# Patient Record
Sex: Male | Born: 1965 | Hispanic: Yes | Marital: Married | Smoking: Never smoker
Health system: Southern US, Community
[De-identification: ages and names within clinical notes are randomized; demographics above are authoritative.]

## PROBLEM LIST (undated history)

## (undated) DIAGNOSIS — E119 Type 2 diabetes mellitus without complications: Secondary | ICD-10-CM

## (undated) DIAGNOSIS — I10 Essential (primary) hypertension: Secondary | ICD-10-CM

---

## 2019-08-13 ENCOUNTER — Other Ambulatory Visit: Payer: Self-pay

## 2019-08-13 ENCOUNTER — Emergency Department (HOSPITAL_BASED_OUTPATIENT_CLINIC_OR_DEPARTMENT_OTHER): Payer: Worker's Compensation

## 2019-08-13 ENCOUNTER — Emergency Department (HOSPITAL_BASED_OUTPATIENT_CLINIC_OR_DEPARTMENT_OTHER)
Admission: EM | Admit: 2019-08-13 | Discharge: 2019-08-13 | Disposition: A | Discharge status: Home / Self Care | Payer: Worker's Compensation | Attending: Emergency Medicine | Admitting: Emergency Medicine

## 2019-08-13 ENCOUNTER — Encounter (HOSPITAL_BASED_OUTPATIENT_CLINIC_OR_DEPARTMENT_OTHER): Payer: Self-pay | Admitting: Emergency Medicine

## 2019-08-13 DIAGNOSIS — E119 Type 2 diabetes mellitus without complications: Secondary | ICD-10-CM | POA: Diagnosis not present

## 2019-08-13 DIAGNOSIS — M20011 Mallet finger of right finger(s): Secondary | ICD-10-CM | POA: Diagnosis not present

## 2019-08-13 DIAGNOSIS — I10 Essential (primary) hypertension: Secondary | ICD-10-CM | POA: Insufficient documentation

## 2019-08-13 DIAGNOSIS — M79644 Pain in right finger(s): Secondary | ICD-10-CM | POA: Diagnosis present

## 2019-08-13 HISTORY — DX: Type 2 diabetes mellitus without complications: E11.9

## 2019-08-13 HISTORY — DX: Essential (primary) hypertension: I10

## 2019-08-13 NOTE — ED Triage Notes (Signed)
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

## 2019-08-13 NOTE — Discharge Instructions (Signed)
Please keep splint on at all times.  Please follow-up with hand surgery in 7-10 days. Please call Monday morning to make this appointment.

## 2019-08-13 NOTE — ED Provider Notes (Signed)
[facility] EMERGENCY DEPARTMENT Provider Note   CSN: [id number] Arrival date & time: 08/13/19  0277     History Chief Complaint  Patient presents with  . Finger Injury    Corey Pollard is a 54 y.o. male.  HPI  Patient is a 54 year old male with a history of DM that is well controlled on HTN.  Patient presents today for injury that occurred just prior to arrival when he was at work and had a impact injury to the tip of his right fourth finger.  He states that he now has his right distal fingertip stuck in position states he cannot move it.  He has no pain, has good sensation in his fingertip and states that he has no numbness, laceration, abrasion or bruising.  He states he can move all other fingers in all the joints.  Patient states he has had no orthopedic surgeries on his hands or fingers in the past.  He denies any other injuries or pain.     Past Medical History:  Diagnosis Date  . Diabetes mellitus without complication (HCC)   . Hypertension     There are no problems to display for this patient.   History reviewed. No pertinent surgical history.     No family history on file.  Social History   Tobacco Use  . Smoking status: Never Smoker  . Smokeless tobacco: Never Used  Substance Use Topics  . Alcohol use: Yes  . Drug use: Not on file    Home Medications Prior to Admission medications   Not on File    Allergies    Patient has no known allergies.  Review of Systems   Review of Systems  Constitutional: Negative for chills and fever.  HENT: Negative for congestion.   Respiratory: Negative for shortness of breath.   Cardiovascular: Negative for chest pain.  Gastrointestinal: Negative for abdominal pain.  Musculoskeletal: Negative for neck pain.       Rt ring finger pain    Physical Exam Updated Vital Signs BP 124/80 (BP Location: Left Arm)   Pulse 62   Temp 99.2 F (37.3 C) (Oral)   Resp 18   Ht 5\' 6"  (1.676 m)   Wt 86.2 kg    SpO2 98%   BMI 30.67 kg/m   Physical Exam Vitals and nursing note reviewed.  Constitutional:      General: He is not in acute distress.    Appearance: Normal appearance. He is not ill-appearing.  HENT:     Head: Normocephalic and atraumatic.  Eyes:     General: No scleral icterus.       Right eye: No discharge.        Left eye: No discharge.     Conjunctiva/sclera: Conjunctivae normal.  Cardiovascular:     Rate and Rhythm: Normal rate.     Pulses: Normal pulses.  Pulmonary:     Effort: Pulmonary effort is normal.     Breath sounds: No stridor.  Musculoskeletal:     Comments: Patient full range of motion of all fingers apart from his right fourth finger which has the DIP stuck in partial flexion with inability to extend the distal phalanx.  Sensation is intact.  Strength in both hands is 5/5.  No bruising or deformity.  No tenderness to palpation of phalanx or DIP joint.  No abrasion or laceration.  Skin:    General: Skin is warm and dry.     Capillary Refill: Capillary refill takes less than  2 seconds.  Neurological:     Mental Status: He is alert and oriented to person, place, and time. Mental status is at baseline.     Sensory: No sensory deficit.  Psychiatric:        Mood and Affect: Mood normal.        Behavior: Behavior normal.     ED Results / Procedures / Treatments   Labs (all labs ordered are listed, but only abnormal results are displayed) Labs Reviewed - No data to display  EKG None  Radiology DG Finger Ring Right  Result Date: 08/13/2019 CLINICAL DATA:  Injury to right ring finger at work. EXAM: RIGHT RING FINGER 2+V COMPARISON:  None FINDINGS: There is flexion at the distal interphalangeal joint on all submitted images. There is no sign of fracture. The interphalangeal joint is located. IMPRESSION: Flexion deformity/mallet finger at the distal interphalangeal joint without signs of fracture, this likely represents injury to the extensor tendon.  Electronically Signed   By: Zetta Bills M.D.   On: 08/13/2019 10:17    Procedures Procedures (including critical care time) SPLINT APPLICATION Date/Time: 16:10 AM Authorized by: Tedd Sias Consent: Verbal consent obtained. Risks and benefits: risks, benefits and alternatives were discussed Consent given by: patient Splint applied by: nurse/orthopedic technician Location details: right 4th finger Splint type: static splint Supplies used: plastic splint/coban Post-procedure: The splinted body part was neurovascularly unchanged following the procedure. Patient tolerance: Patient tolerated the procedure well with no immediate complications.    Medications Ordered in ED Medications - No data to display  ED Course  I have reviewed the triage vital signs and the nursing notes.  Pertinent labs & imaging results that were available during my care of the patient were reviewed by me and considered in my medical decision making (see chart for details).    MDM Rules/Calculators/A&P                      Patient with injury to his distal phalanx that occurred just prior to arrival at ED.   Patient has obvious mallet finger.  X-ray personally viewed by myself there is no evidence of fracture.  Pending radiologist read to confirm this.   Physical exam notable for his right ring finger distal phalanx second flexion.  Will splint in extension and have him follow-up with Dr. Fredna Dow of hand surgery in next 7 to 10 days.   I discussed this case with my attending physician who cosigned this note including patient's presenting symptoms, physical exam, and planned diagnostics and interventions. Attending physician stated agreement with plan or made changes to plan which were implemented.   Final Clinical Impression(s) / ED Diagnoses Final diagnoses:  Mallet finger of right hand    Rx / DC Orders ED Discharge Orders    None       Tedd Sias, [place] 08/13/19 [place],  DO 08/13/19 1030

## 2020-11-16 IMAGING — CR DG FINGER RING 2+V*R*
3 series · 3 of 3 positions shown · non-contrast
Comparison: None

CLINICAL DATA: Injury to right ring finger at work.

EXAM:
RIGHT RING FINGER 2+V

[x finger pa right]
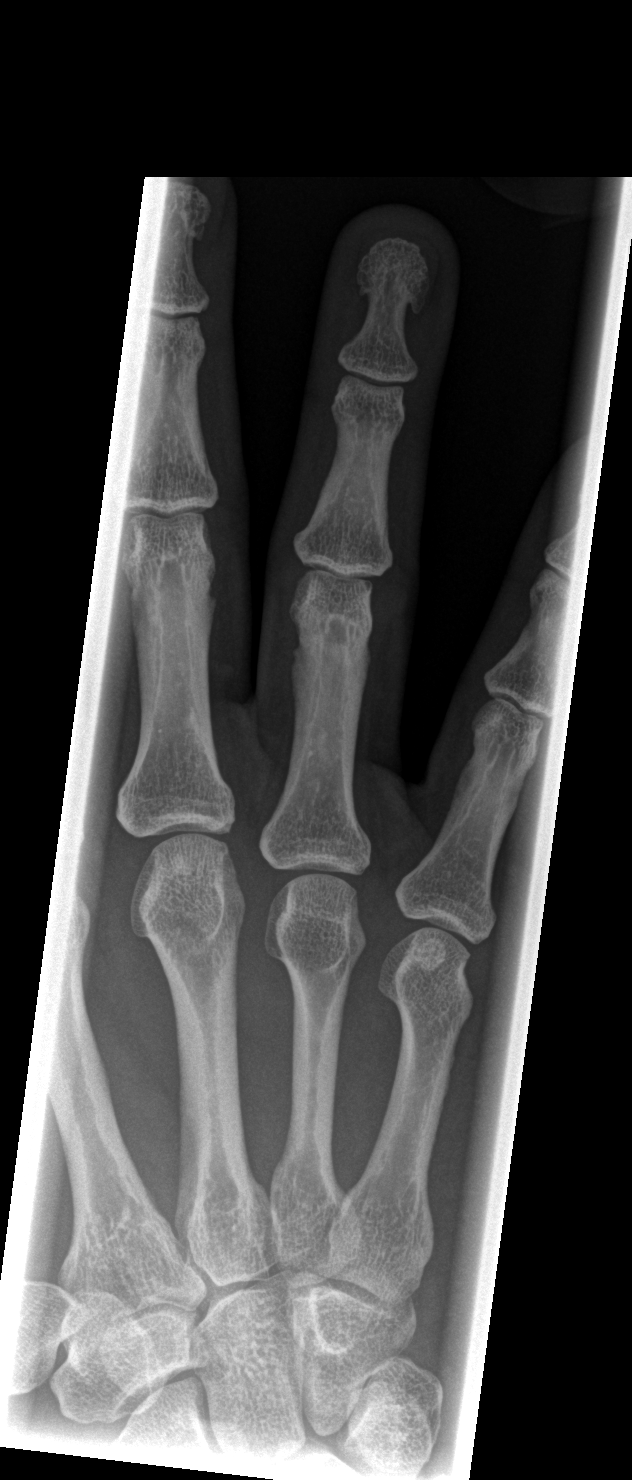

[x finger obl. right]
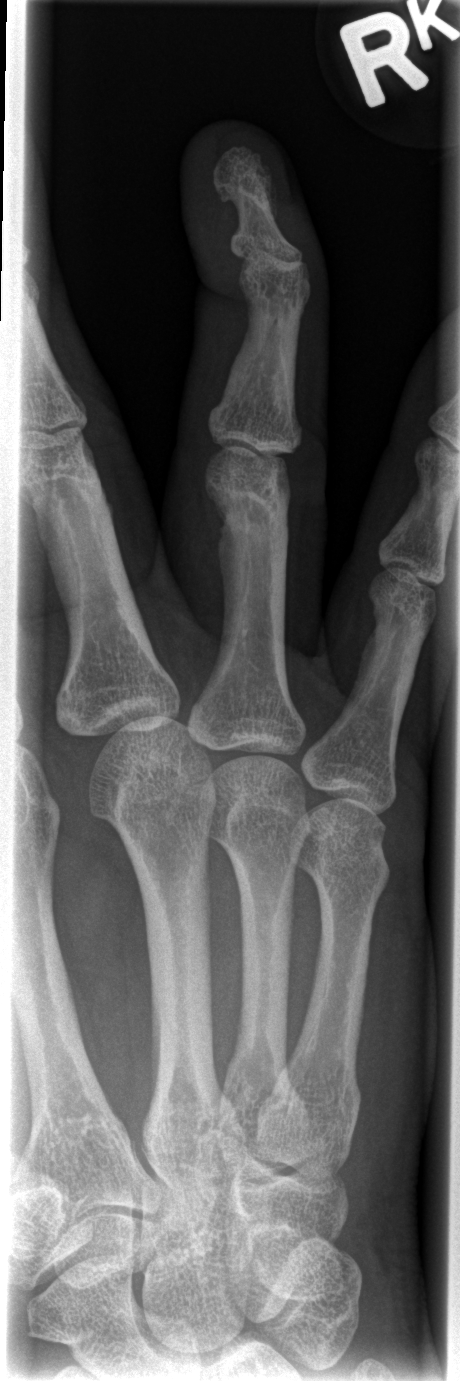

[x finger lateral right]
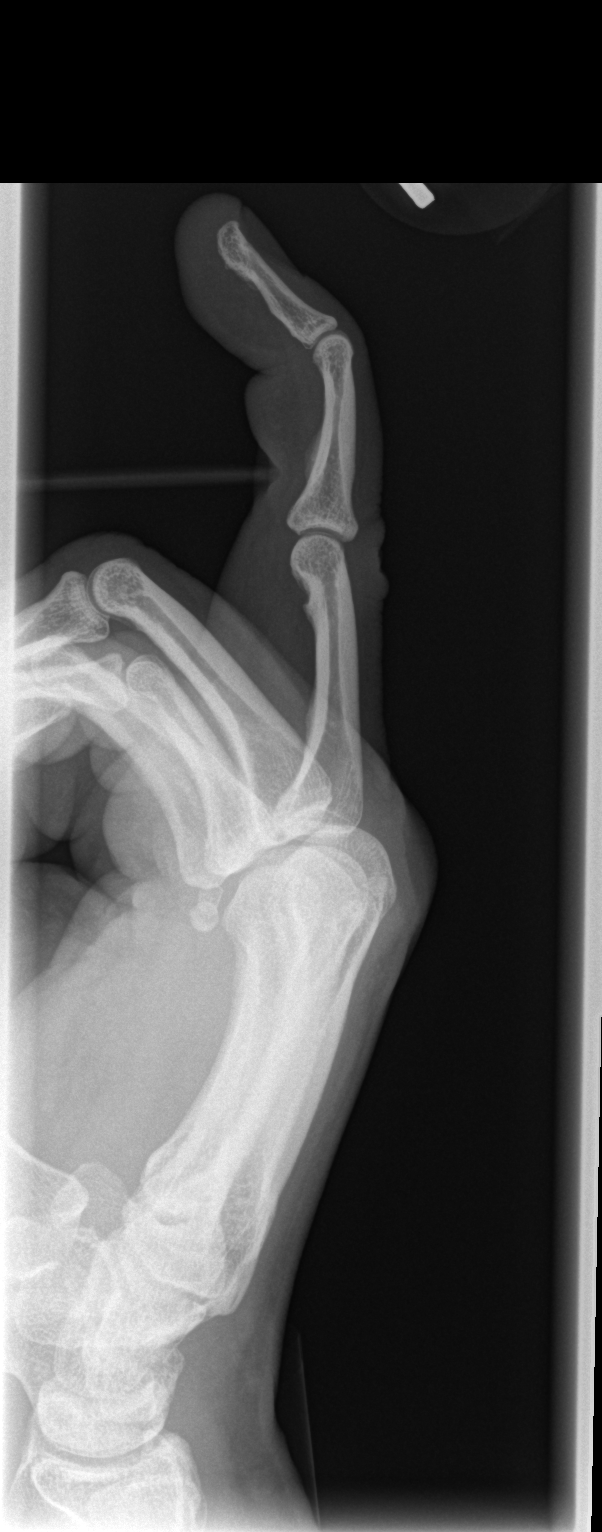

[3 of 3 positions shown; findings below may reference images not displayed]

FINDINGS: There is flexion at the distal interphalangeal joint on all
submitted images. There is no sign of fracture. The interphalangeal
joint is located.
IMPRESSION: Flexion deformity/mallet finger at the distal interphalangeal joint
without signs of fracture, this likely represents injury to the
extensor tendon.
